# Patient Record
Sex: Male | Born: 1985 | Race: White | Hispanic: No | Marital: Married | Smoking: Never smoker
Health system: Southern US, Community
[De-identification: ages and names within clinical notes are randomized; demographics above are authoritative.]

---

## 2012-06-27 ENCOUNTER — Encounter (HOSPITAL_BASED_OUTPATIENT_CLINIC_OR_DEPARTMENT_OTHER): Payer: Self-pay | Admitting: *Deleted

## 2012-06-27 ENCOUNTER — Emergency Department (HOSPITAL_BASED_OUTPATIENT_CLINIC_OR_DEPARTMENT_OTHER): Payer: 59

## 2012-06-27 ENCOUNTER — Emergency Department (HOSPITAL_BASED_OUTPATIENT_CLINIC_OR_DEPARTMENT_OTHER)
Admission: EM | Admit: 2012-06-27 | Discharge: 2012-06-27 | Disposition: A | Discharge status: Home / Self Care | Payer: 59 | Attending: Emergency Medicine | Admitting: Emergency Medicine

## 2012-06-27 DIAGNOSIS — Y929 Unspecified place or not applicable: Secondary | ICD-10-CM | POA: Insufficient documentation

## 2012-06-27 DIAGNOSIS — Y939 Activity, unspecified: Secondary | ICD-10-CM | POA: Insufficient documentation

## 2012-06-27 DIAGNOSIS — S335XXA Sprain of ligaments of lumbar spine, initial encounter: Secondary | ICD-10-CM | POA: Insufficient documentation

## 2012-06-27 DIAGNOSIS — X500XXA Overexertion from strenuous movement or load, initial encounter: Secondary | ICD-10-CM | POA: Insufficient documentation

## 2012-06-27 DIAGNOSIS — S39012A Strain of muscle, fascia and tendon of lower back, initial encounter: Secondary | ICD-10-CM

## 2012-06-27 MED ORDER — IBUPROFEN 800 MG PO TABS
800.0000 mg | ORAL_TABLET | Freq: Once | ORAL | Status: AC
  Administered 2012-06-27: 800 mg via ORAL
  Filled 2012-06-27: qty 1

## 2012-06-27 MED ORDER — CYCLOBENZAPRINE HCL 10 MG PO TABS
5.0000 mg | ORAL_TABLET | Freq: Once | ORAL | Status: AC
  Administered 2012-06-27: 5 mg via ORAL
  Filled 2012-06-27: qty 1

## 2012-06-27 MED ORDER — OXYCODONE-ACETAMINOPHEN 5-325 MG PO TABS: 1.0000 | ORAL_TABLET | ORAL | Status: DC | PRN

## 2012-06-27 NOTE — ED Notes (Signed)
Pt states he injured his lower back while lifting weights about 15 min ago.

## 2012-06-27 NOTE — ED Provider Notes (Addendum)
History  This chart was scribed for Hilario Quarry, MD by Shari Heritage, ED Scribe. The patient was seen in room MHT13/MHT13. Patient's care was started at 1842.   CSN: [id number]  Arrival date & time 06/27/12  1831   First MD Initiated Contact with Patient 06/27/12 1842      Chief Complaint  Patient presents with  . Back Pain     Patient is a 27 y.o. male presenting with back pain. The history is provided by the patient. No language interpreter was used.  Back Pain Location:  Lumbar spine Quality:  Stiffness Stiffness is present:  Unable to specify Radiates to:  Does not radiate Pain severity:  Moderate Pain is:  Same all the time Onset quality:  Sudden Duration:  1 hour Timing:  Constant Associated symptoms: no numbness, no tingling and no weakness      HPI Comments: Marvin Baker is a 27 y.o. male who presents to the Emergency Department complaining of  constant, moderate, right lower back pain that began less than 1 hour ago. He describes pain as stiffness. Patient states that at onset pain was 10/10, but is now 7-8/10. He says that he was doing dead lifts when he heard something pop and started having pain. 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 He denies any tingling, numbness or weakness in his legs. Patient drove himself to the ED. He reports no pertinent past medical or surgical history.  History reviewed. No pertinent past medical history.  History reviewed. No pertinent past surgical history.  History reviewed. No pertinent family history.  History  Substance Use Topics  . Smoking status: Never Smoker   . Smokeless tobacco: Not on file  . Alcohol Use: No      Review of Systems  Musculoskeletal: Positive for back pain.  Neurological: Negative for tingling, weakness and numbness.    Allergies  Review of patient's allergies indicates no known allergies.  Home Medications  No current outpatient prescriptions on file.  Triage Vitals: BP 151/61  Pulse 56   Temp(Src) 97.5 F (36.4 C) (Oral)  Resp 18  Ht 6\' 2"  (1.88 m)  Wt 210 lb (95.255 kg)  BMI 26.95 kg/m2  SpO2 99%  Physical Exam  Nursing note and vitals reviewed. Constitutional: He appears well-developed and well-nourished. No distress.  Awake, alert, nontoxic appearance with baseline speech.  HENT:  Head: Normocephalic and atraumatic.  Eyes: Conjunctivae and EOM are normal. Pupils are equal, round, and reactive to light.  Neck: Normal range of motion. Neck supple.  Cardiovascular: Normal rate and regular rhythm.   Pulmonary/Chest: Effort normal and breath sounds normal.  Abdominal: Soft. There is no tenderness. There is no rebound.  Musculoskeletal:       Lumbar back: He exhibits tenderness.  Mild tenderness to left paralumbar area.  Neurological:  Mental status baseline for patient.  Upper extremity motor strength and sensation intact and symmetric bilaterally.  Skin: Skin is warm and dry. No rash noted.  Psychiatric: He has a normal mood and affect. His behavior is normal.    ED Course  Procedures (including critical care time) DIAGNOSTIC STUDIES: Oxygen Saturation is 99% on room air, normal by my interpretation.    COORDINATION OF CARE: 6:56 PM- Patient informed of current plan for treatment and evaluation and agrees with plan at this time.      Labs Reviewed - No data to display No results found.   No diagnosis found.    MDM  Dg Lumbar Spine  Complete  06/27/2012  *RADIOLOGY REPORT*  Clinical Data: Weight lifting injury.  LUMBAR SPINE - COMPLETE 4+ VIEW  Comparison: None.  Findings: Vertebral body height and alignment are normal. Intervertebral disc space height is maintained.  Schmorl's nodes in the lumbar spine are noted.  IMPRESSION: No acute finding.   Original Report Authenticated By: Holley Dexter, M.D.     I personally performed the services described in this documentation, which was scribed in my presence. The recorded information has been reviewed  and considered.   Hilario Quarry, MD 06/27/12 [id number]  Hilario Quarry, MD 06/27/12 [phone]

## 2014-04-05 IMAGING — CR DG LUMBAR SPINE COMPLETE 4+V
5 series · 5 of 5 positions shown · non-contrast
Comparison: None.

CLINICAL DATA: Weight lifting injury.

LUMBAR SPINE - COMPLETE 4+ VIEW

[t l-spine a.p.]
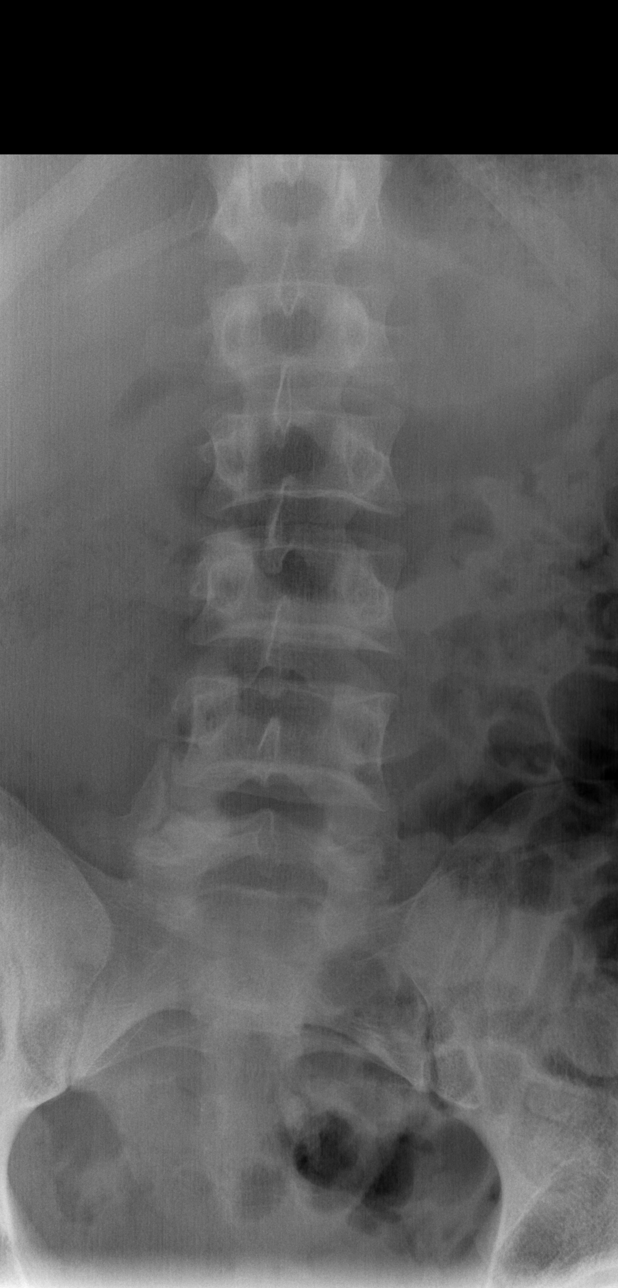

[t l-spine oblique exposure (1 of 2)]
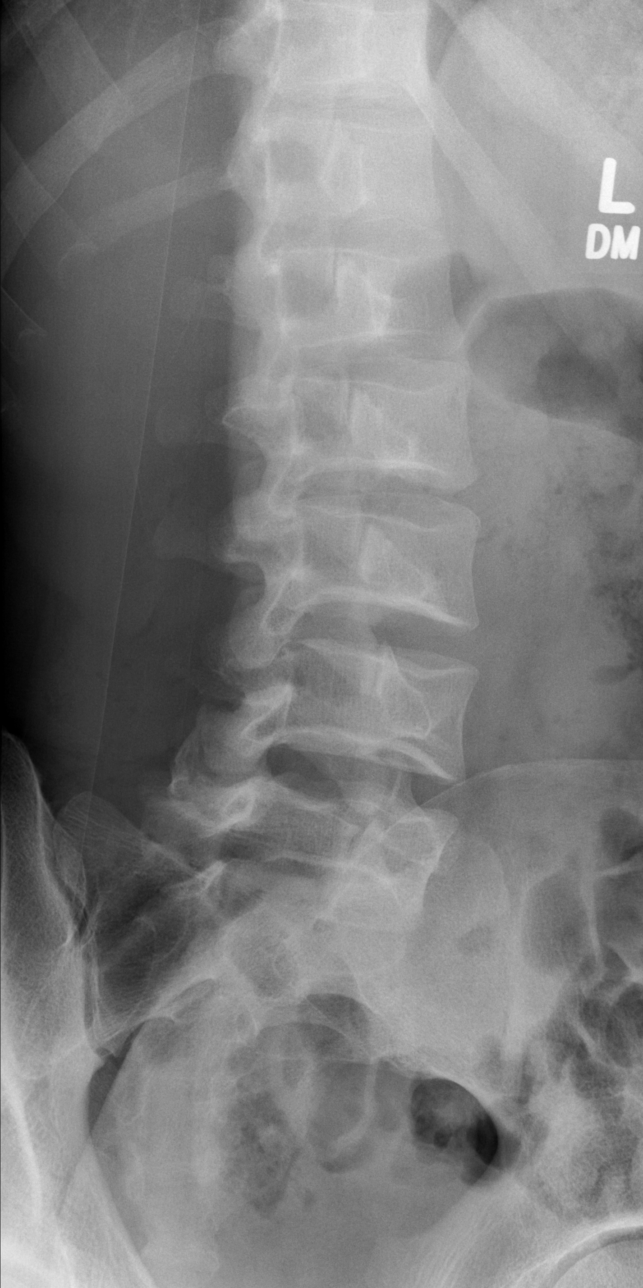

[t l-spine lat]
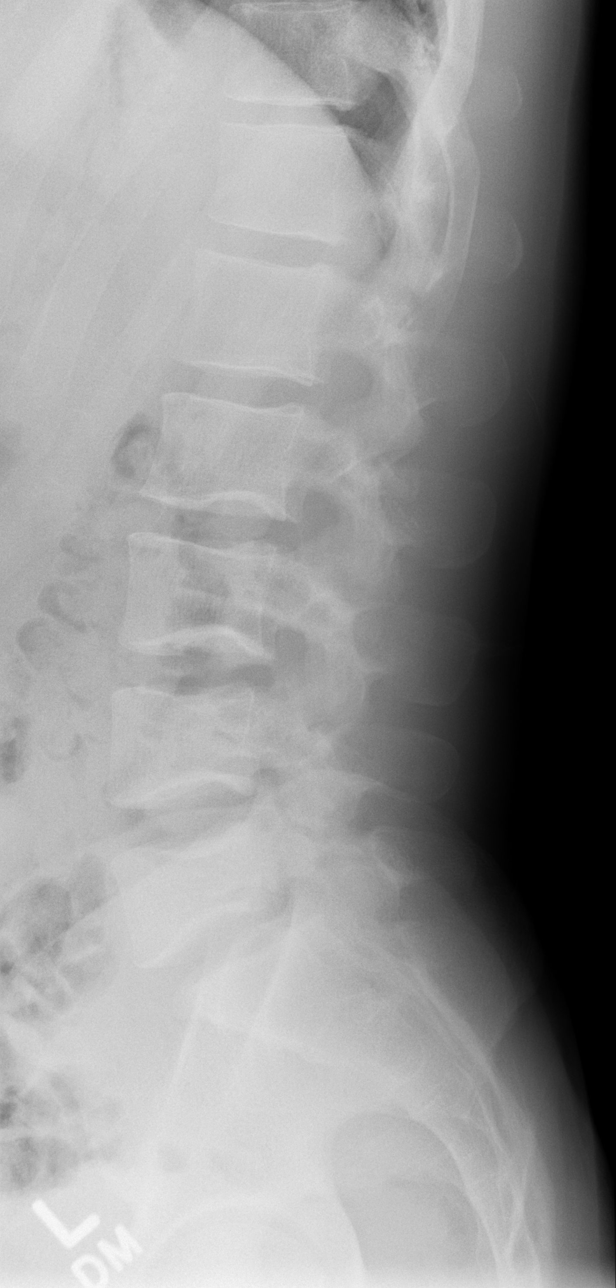

[t l-spine l5-s1 spot]
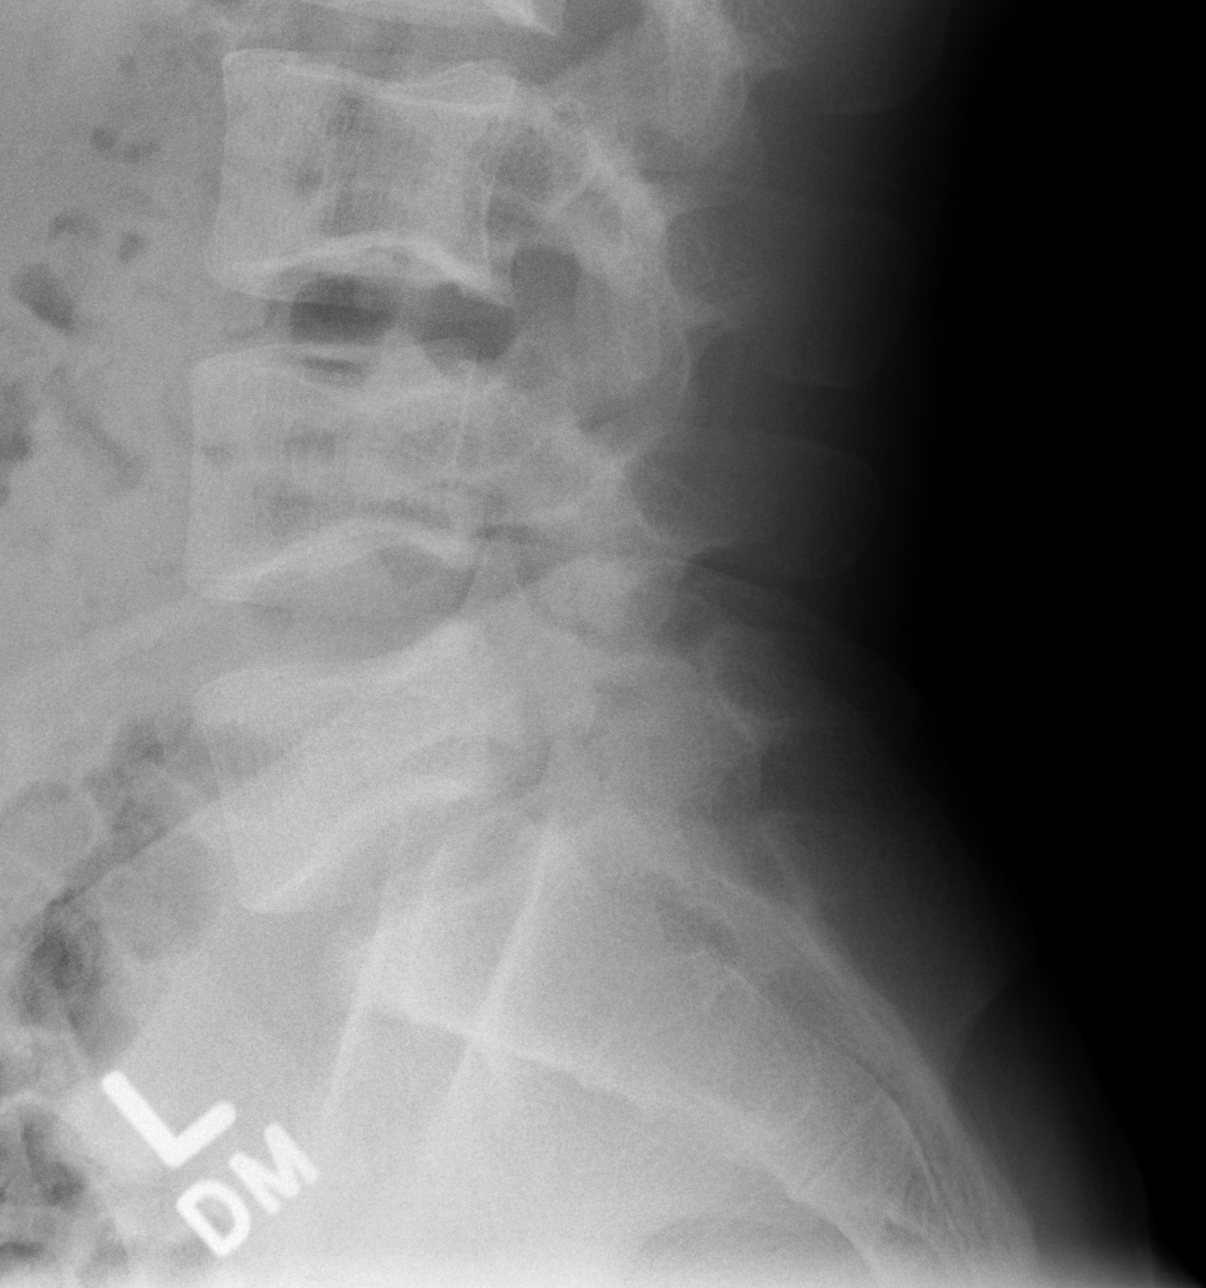

[t l-spine oblique exposure (2 of 2)]
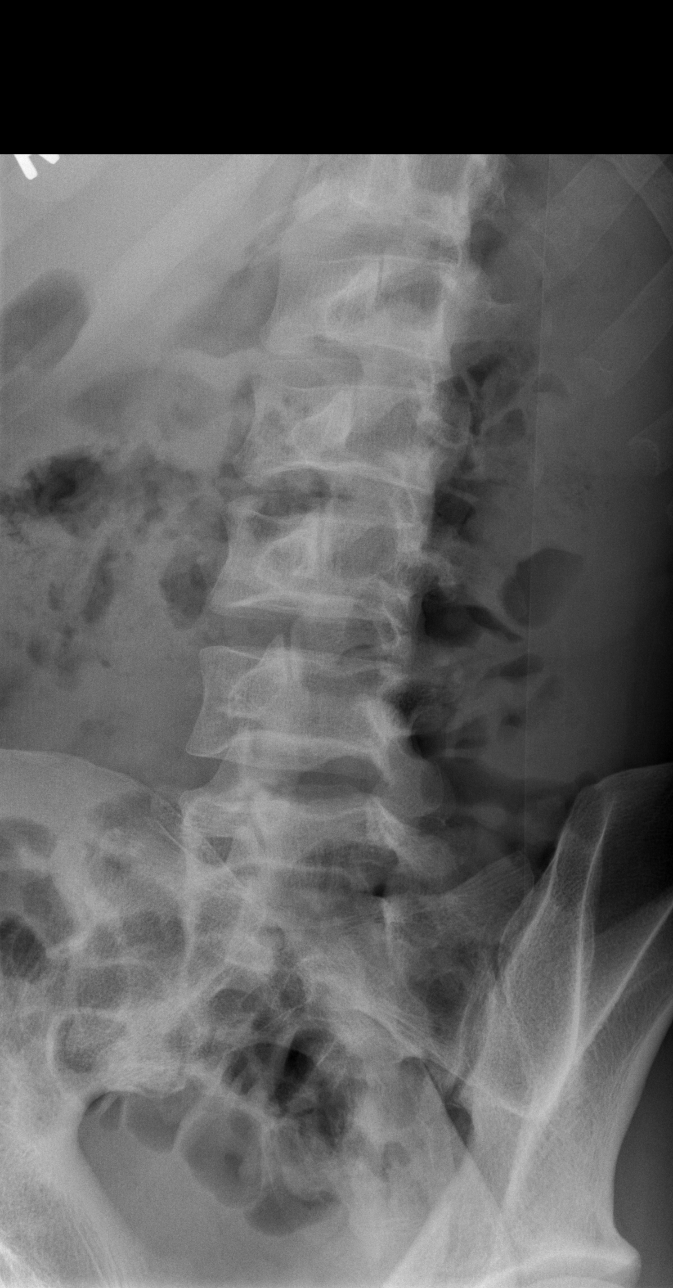

[5 of 5 positions shown; findings below may reference images not displayed]

FINDINGS: Vertebral body height and alignment are normal.
Intervertebral disc space height is maintained.  Schmorl's nodes in
the lumbar spine are noted.
IMPRESSION: No acute finding.

## 2015-08-22 ENCOUNTER — Emergency Department (HOSPITAL_COMMUNITY): Payer: Self-pay

## 2015-08-22 ENCOUNTER — Emergency Department (HOSPITAL_COMMUNITY)
Admission: EM | Admit: 2015-08-22 | Discharge: 2015-08-22 | Disposition: A | Discharge status: Home / Self Care | Payer: Self-pay | Attending: Emergency Medicine | Admitting: Emergency Medicine

## 2015-08-22 ENCOUNTER — Encounter (HOSPITAL_COMMUNITY): Payer: Self-pay | Admitting: *Deleted

## 2015-08-22 DIAGNOSIS — S299XXA Unspecified injury of thorax, initial encounter: Secondary | ICD-10-CM

## 2015-08-22 DIAGNOSIS — W208XXA Other cause of strike by thrown, projected or falling object, initial encounter: Secondary | ICD-10-CM | POA: Insufficient documentation

## 2015-08-22 DIAGNOSIS — S29001A Unspecified injury of muscle and tendon of front wall of thorax, initial encounter: Secondary | ICD-10-CM | POA: Insufficient documentation

## 2015-08-22 DIAGNOSIS — Y93B3 Activity, free weights: Secondary | ICD-10-CM | POA: Insufficient documentation

## 2015-08-22 DIAGNOSIS — Y9289 Other specified places as the place of occurrence of the external cause: Secondary | ICD-10-CM | POA: Insufficient documentation

## 2015-08-22 DIAGNOSIS — Y998 Other external cause status: Secondary | ICD-10-CM | POA: Insufficient documentation

## 2015-08-22 MED ORDER — IBUPROFEN 800 MG PO TABS
800.0000 mg | ORAL_TABLET | Freq: Once | ORAL | Status: AC
  Administered 2015-08-22: 800 mg via ORAL
  Filled 2015-08-22: qty 1

## 2015-08-22 MED ORDER — ACETAMINOPHEN 325 MG PO TABS
650.0000 mg | ORAL_TABLET | Freq: Once | ORAL | Status: AC
  Administered 2015-08-22: 650 mg via ORAL
  Filled 2015-08-22: qty 2

## 2015-08-22 MED ORDER — IBUPROFEN 800 MG PO TABS: 800.0000 mg | ORAL_TABLET | Freq: Three times a day (TID) | ORAL | Status: AC

## 2015-08-22 NOTE — Discharge Instructions (Signed)
Your xray today shows no abnormalities.  EKG is normal.  Please take Ibuprofen 3 times daily for pain.  [facility]  Return to the ED if you experience worsening chest pain, SOB, or difficulty breathing.  Blunt Chest Trauma Blunt chest trauma is an injury caused by a blow to the chest. These chest injuries can be very painful. Blunt chest trauma often results in bruised or broken (fractured) ribs. Most cases of bruised and fractured ribs from blunt chest traumas get better after 1 to 3 weeks of rest and pain medicine. Often, the soft tissue in the chest wall is also injured, causing pain and bruising. Internal organs, such as the heart and lungs, may also be injured. Blunt chest trauma can lead to serious medical problems. This injury requires immediate medical care. CAUSES   Motor vehicle collisions.  Falls.  Physical violence.  Sports injuries. SYMPTOMS   Chest pain. The pain may be worse when you move or breathe deeply.  Shortness of breath.  Lightheadedness.  Bruising.  Tenderness.  Swelling. DIAGNOSIS  Your caregiver will do a physical exam. X-rays may be taken to look for fractures. However, minor rib fractures may not show up on X-rays until a few days after the injury. If a more serious injury is suspected, further imaging tests may be done. This may include ultrasounds, computed tomography (CT) scans, or magnetic resonance imaging (MRI). TREATMENT  Treatment depends on the severity of your injury. Your caregiver may prescribe pain medicines and deep breathing exercises. HOME CARE INSTRUCTIONS  Limit your activities until you can move around without much pain.  Do not do any strenuous work until your injury is healed.  Put ice on the injured area.  Put ice in a plastic bag.  Place a towel between your skin and the bag.  Leave the ice on for 15-20 minutes, 03-04 times a day.  You may wear a rib belt as directed  by your caregiver to reduce pain.  Practice deep breathing as directed by your caregiver to keep your lungs clear.  Only take over-the-counter or prescription medicines for pain, fever, or discomfort as directed by your caregiver. SEEK IMMEDIATE MEDICAL CARE IF:   You have increasing pain or shortness of breath.  You cough up blood.  You have nausea, vomiting, or abdominal pain.  You have a fever.  You feel dizzy, weak, or you faint. MAKE SURE YOU:  Understand these instructions.  Will watch your condition.  Will get help right away if you are not doing well or get worse.   This information is not intended to replace advice given to you by your health care provider. Make sure you discuss any questions you have with your health care provider.   Document Released: 05/15/2004 Document Revised: 04/28/2014 Document Reviewed: 10/04/2014 Elsevier Interactive Patient Education [employer]

## 2015-08-22 NOTE — ED Provider Notes (Signed)
CSN: [id number]     Arrival date & time 08/22/15  1716 History   First MD Initiated Contact with Patient 08/22/15 1750     Chief Complaint  Patient presents with  . Chest Injury     (Consider location/radiation/quality/duration/timing/severity/associated sxs/prior Treatment) HPI Marvin Baker is a 30 y.o. male with no significant PMH who presents sudden onset, constant, improving chest soreness approximately 5 PM (1 hour ago).  Patient reports he was bench pressing 405 lbs when his wrist slipped causing the barbell to fall onto his chest.  He states the barbell was approximally 6-12 inches off of his chest.  Aggravating factors include deep inspiration and movement.  Pain 6/10.  Denies SOB, cough, abdominal pain, head injury, LOC, N/V/D, or urinary symptoms.   History reviewed. No pertinent past medical history. History reviewed. No pertinent past surgical history. No family history on file. Social History  Substance Use Topics  . Smoking status: Never Smoker   . Smokeless tobacco: None  . Alcohol Use: No    Review of Systems All other systems negative unless otherwise stated in HPI    Allergies  Review of patient's allergies indicates no known allergies.  Home Medications   Prior to Admission medications   Medication Sig Start Date End Date Taking? Authorizing Provider  cetirizine (ZYRTEC) 10 MG tablet Take 10 mg by mouth daily as needed for allergies.   Yes Historical Provider, MD  Phenylephrine HCl (AFRIN ALLERGY NA) Place 1 spray into the nose daily as needed (congestion).   Yes Historical Provider, MD   BP 143/75 mmHg  Pulse 80  Temp(Src) 98.2 F (36.8 C) (Oral)  Resp 14  Ht  (1.905 m)  Wt 117.935 kg  BMI 32.50 kg/m2  SpO2 100% Physical Exam  Constitutional: He is oriented to person, place, and time. He appears well-developed and well-nourished.  Non-toxic appearance. He does not have a sickly appearance. He does not appear ill.  HENT:  Head: Normocephalic  and atraumatic.  Mouth/Throat: Oropharynx is clear and moist.  Eyes: Conjunctivae are normal. Pupils are equal, round, and reactive to light.  Neck: Normal range of motion. Neck supple.  Cardiovascular: Normal rate, regular rhythm and normal heart sounds.   No murmur heard. Pulmonary/Chest: Effort normal and breath sounds normal. No accessory muscle usage or stridor. No respiratory distress. He has no wheezes. He has no rhonchi. He has no rales. He exhibits tenderness (mild).    Abdominal: Soft. Bowel sounds are normal. He exhibits no distension. There is no tenderness.  Musculoskeletal: Normal range of motion.  Lymphadenopathy:    He has no cervical adenopathy.  Neurological: He is alert and oriented to person, place, and time.  Speech clear without dysarthria.  Skin: Skin is warm and dry.  Psychiatric: He has a normal mood and affect. His behavior is normal.    ED Course  Procedures (including critical care time) Labs Review Labs Reviewed - No data to display  Imaging Review Dg Chest 2 View  08/22/2015  CLINICAL DATA:  Trauma to chest by 400 pound barbell. EXAM: CHEST  2 VIEW COMPARISON:  None. FINDINGS: Heart size is normal. Overall cardiomediastinal silhouette is normal in size and configuration. Lungs are clear. No pleural effusion or pneumothorax seen. Osseous and soft tissue structures about the chest are unremarkable. Specifically, no sternal or rib fracture/displacement identified. IMPRESSION: Negative. Electronically Signed   By: Bary Richard M.D.   On: 08/22/2015 18:15   I have personally reviewed and evaluated these images  and lab results as part of my medical decision-making.   EKG Interpretation   Date/Time:  Wednesday Aug 22 2015 18:20:43 EDT Ventricular Rate:  77 PR Interval:  128 QRS Duration: 88 QT Interval:  371 QTC Calculation: 420 R Axis:   75 Text Interpretation:  Sinus rhythm Confirmed by HAVILAND MD, JULIE ([id number])  on 08/22/2015 6:24:49 PM      MDM     Final diagnoses:  Chest injury, initial encounter   Patient presents for evaluation of chest injury. VSS, NAD. On exam, heart RRR, lungs CTAP, abdomen soft and benign. Chest wall mildly tender to wear barbell fell. No crepitus. Chest x-ray negative. EKG normal without acute changes. Patient received ibuprofen and Tylenol in the ED. Discharged home with ibuprofen. Discussed return precautions. Follow-up with [facility] wellness clinic as needed. Patient agrees and acknowledges the above plan for discharge.  Case has been discussed with Dr. Particia NearingHaviland who agrees with the above plan for discharge.      Cheri FowlerKayla Ra Pfiester, PA-C 08/22/15 1836

## 2015-08-22 NOTE — ED Notes (Signed)
Pt states a 405lb barbell fell onto his chest while he was benchpressing today. Pt states the barbell slipped off his wrist and onto his chest. Pt states the pain is worse while taking a deep breath. Pain is 6/10.

## 2019-04-15 ENCOUNTER — Other Ambulatory Visit: Payer: Self-pay

## 2019-04-15 ENCOUNTER — Emergency Department (HOSPITAL_COMMUNITY)
Admission: EM | Admit: 2019-04-15 | Discharge: 2019-04-15 | Disposition: A | Discharge status: Home / Self Care | Payer: Managed Care, Other (non HMO) | Attending: Emergency Medicine | Admitting: Emergency Medicine

## 2019-04-15 ENCOUNTER — Emergency Department (HOSPITAL_COMMUNITY): Payer: Managed Care, Other (non HMO)

## 2019-04-15 DIAGNOSIS — Y29XXXA Contact with blunt object, undetermined intent, initial encounter: Secondary | ICD-10-CM | POA: Insufficient documentation

## 2019-04-15 DIAGNOSIS — Y9289 Other specified places as the place of occurrence of the external cause: Secondary | ICD-10-CM | POA: Insufficient documentation

## 2019-04-15 DIAGNOSIS — Y999 Unspecified external cause status: Secondary | ICD-10-CM | POA: Insufficient documentation

## 2019-04-15 DIAGNOSIS — S0993XA Unspecified injury of face, initial encounter: Secondary | ICD-10-CM | POA: Diagnosis present

## 2019-04-15 DIAGNOSIS — J3489 Other specified disorders of nose and nasal sinuses: Secondary | ICD-10-CM

## 2019-04-15 DIAGNOSIS — S022XXA Fracture of nasal bones, initial encounter for closed fracture: Secondary | ICD-10-CM

## 2019-04-15 DIAGNOSIS — Y939 Activity, unspecified: Secondary | ICD-10-CM | POA: Diagnosis not present

## 2019-04-15 MED ORDER — NAPROXEN 500 MG PO TABS: 500.0000 mg | ORAL_TABLET | Freq: Two times a day (BID) | ORAL | 0 refills | Status: AC

## 2019-04-15 NOTE — Discharge Instructions (Signed)
Please take the naproxen, as prescribed.  Avoid blowing your nose.  Please follow-up with the ENT in 6 to 10 days for ongoing evaluation and management of your nondisplaced nasal fracture.  Return to the ED or seek medical attention immediately for any inability to breathe in and out of your nose or any uncontrolled epistaxis.  Return for any other new or worsening symptoms.

## 2019-04-15 NOTE — ED Triage Notes (Signed)
Pt arrives pov after getting hit by a door in the face. States he thinks he broke his nose. Pt in NAD.

## 2019-04-15 NOTE — ED Provider Notes (Signed)
[facility] EMERGENCY DEPARTMENT Provider Note   CSN: [id number] Arrival date & time: 04/15/19  1053     History Chief Complaint  Patient presents with  . Facial Pain    Marvin Baker is a 33 y.o. male with no significant PMH presents to the ED after sustaining a facial injury.  He reports that he was visiting family at [employer] when he turned into a door that he thought his wife was holding open for him.  Hemostasis achieved.  No significant discomfort, or obvious deformity.  No other complaints at this time he was feeling well prior to accident.  Denies any difficulty breathing, trismus, ability to swallow, neck discomfort, headache, blurred vision, or other symptoms.  HPI     No past medical history on file.  There are no problems to display for this patient.   No past surgical history on file.     No family history on file.  Social History   Tobacco Use  . Smoking status: Never Smoker  Substance Use Topics  . Alcohol use: No  . Drug use: No    Home Medications Prior to Admission medications   Medication Sig Start Date End Date Taking? Authorizing Provider  cetirizine (ZYRTEC) 10 MG tablet Take 10 mg by mouth daily as needed for allergies.    [provider]  ibuprofen (ADVIL,MOTRIN) 800 MG tablet Take 1 tablet (800 mg total) by mouth 3 (three) times daily. 08/22/15   Gloriann Loan, PA-C  naproxen (NAPROSYN) 500 MG tablet Take 1 tablet (500 mg total) by mouth 2 (two) times daily. 04/15/19   Corena Herter, PA-C  Phenylephrine HCl (AFRIN ALLERGY NA) Place 1 spray into the nose daily as needed (congestion).    [provider]    Allergies    Patient has no known allergies.  Review of Systems   Review of Systems  Constitutional: Negative for chills and fever.  HENT: Positive for nosebleeds. Negative for drooling, facial swelling and trouble swallowing.   Eyes: Negative for visual disturbance.  Neurological: Negative  for dizziness and headaches.    Physical Exam Updated Vital Signs BP (!) 144/84 (BP Location: Right Arm)   Pulse 83   Temp 98 F (36.7 C) (Oral)   Resp 16   SpO2 96%   Physical Exam Vitals and nursing note reviewed. Exam conducted with a chaperone present.  Constitutional:      Appearance: Normal appearance.  HENT:     Head: Normocephalic and atraumatic.     Nose:     Comments: Obvious deformity.  Possible deviated septum, but no obvious septal hematomas.  Hemostasis achieved.  Nares patent bilaterally.  Able to breathe through each nare.    Mouth/Throat:     Comments: No deformities, bleeding, or broken teeth. Eyes:     General: No scleral icterus.    Extraocular Movements: Extraocular movements intact.     Conjunctiva/sclera: Conjunctivae normal.     Pupils: Pupils are equal, round, and reactive to light.     Comments: No visual impairment.  Cardiovascular:     Rate and Rhythm: Normal rate and regular rhythm.     Pulses: Normal pulses.     Heart sounds: Normal heart sounds.  Pulmonary:     Effort: Pulmonary effort is normal. No respiratory distress.     Breath sounds: Normal breath sounds.  Musculoskeletal:     Cervical back: Normal range of motion and neck supple. No rigidity or tenderness.  Skin:  General: Skin is dry.  Neurological:     Mental Status: He is alert.     GCS: GCS eye subscore is 4. GCS verbal subscore is 5. GCS motor subscore is 6.  Psychiatric:        Mood and Affect: Mood normal.        Behavior: Behavior normal.        Thought Content: Thought content normal.       ED Results / Procedures / Treatments   Labs (all labs ordered are listed, but only abnormal results are displayed) Labs Reviewed - No data to display  EKG None  Radiology DG Nasal Bones  Result Date: 04/15/2019 CLINICAL DATA:  33 year old who was struck in the nose by a door earlier today. Pain and bleeding. Initial encounter. EXAM: NASAL BONES - 3+ VIEW COMPARISON:   None. FINDINGS: There is evidence of a nondisplaced fracture involving the tip of the nasal bone. There may be a nondisplaced fracture involving the mid portion of the nasal bones. The bony nasal septum appears midline. IMPRESSION: 1. Nondisplaced fracture involving the tip of the nasal bone. 2. Possible nondisplaced fracture involving the mid portion of the nasal bones. Electronically Signed   By: Hulan Saas M.D.   On: 04/15/2019 12:46    Procedures Procedures (including critical care time)  Medications Ordered in ED Medications - No data to display  ED Course  I have reviewed the triage vital signs and the nursing notes.  Pertinent labs & imaging results that were available during my care of the patient were reviewed by me and considered in my medical decision making (see chart for details).    MDM Rules/Calculators/A&P                      DG facial bones are reviewed and demonstrate a nondisplaced fracture involving the tip of the nasal bone as well as a possibly nondisplaced fracture involving the midportion of the nasal bones.  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  Given the nondisplaced quality of fractures and chronic deformity, we will not attempt to reduce.  Patient is able to present for both nares and there is no evidence of a septal hematoma this time.  Patient has no acute distress and had no significant pain or discomfort with me touching the tip of his nose to look into his nares bilaterally.  Will refer patient to ENT for follow-up in 6 days and encouraged him to avoid blowing his nose.  Strict return precautions including inability to breathe in and out to his nose or uncontrolled epistaxis.  Will prescribe naproxen for his pain and swelling.   Final Clinical Impression(s) / ED Diagnoses Final diagnoses:  Closed fracture of nasal bone, initial encounter    Rx / DC Orders  ED Discharge Orders         Ordered    naproxen (NAPROSYN) 500 MG tablet  2 times daily     04/15/19 1307           Elvera Maria 04/15/19 1310    Loren Racer, MD 04/15/19 1357

## 2019-04-15 NOTE — ED Notes (Signed)
Patient verbalizes understanding of discharge instructions. Opportunity for questioning and answers were provided. Armband removed by staff, pt discharged from ED.  

## 2021-01-21 IMAGING — CR DG NASAL BONES 3+V
3 series · 3 of 3 positions shown · non-contrast
Comparison: None.

CLINICAL DATA: 33-year-old who was struck in the nose by a door
earlier today. Pain and bleeding. Initial encounter.

EXAM:
NASAL BONES - 3+ VIEW

[nasal waters]
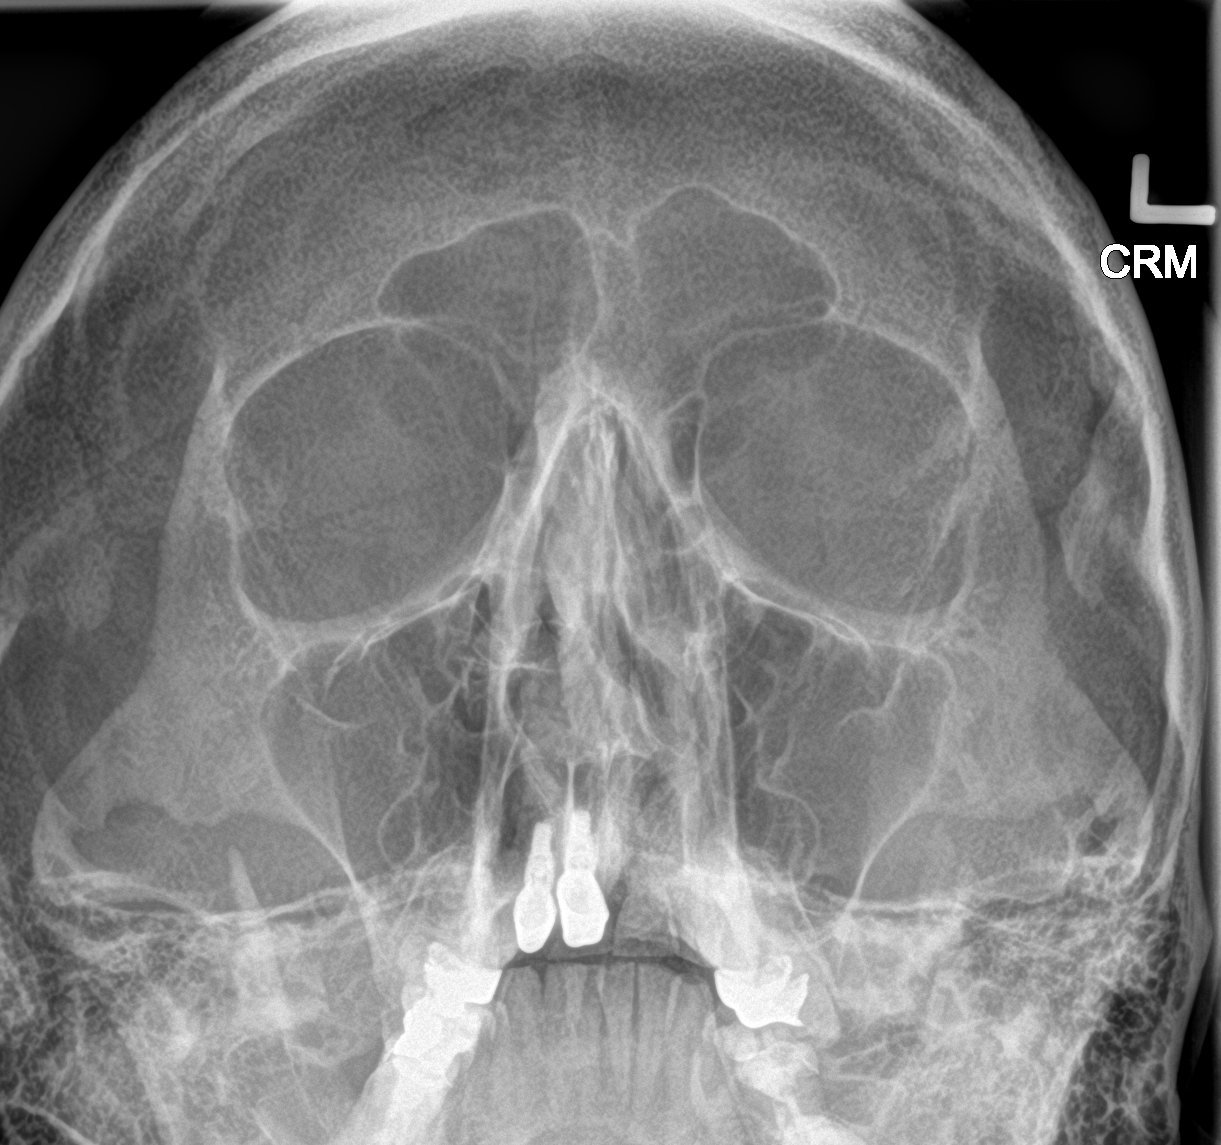

[nasal lat (1 of 2)]
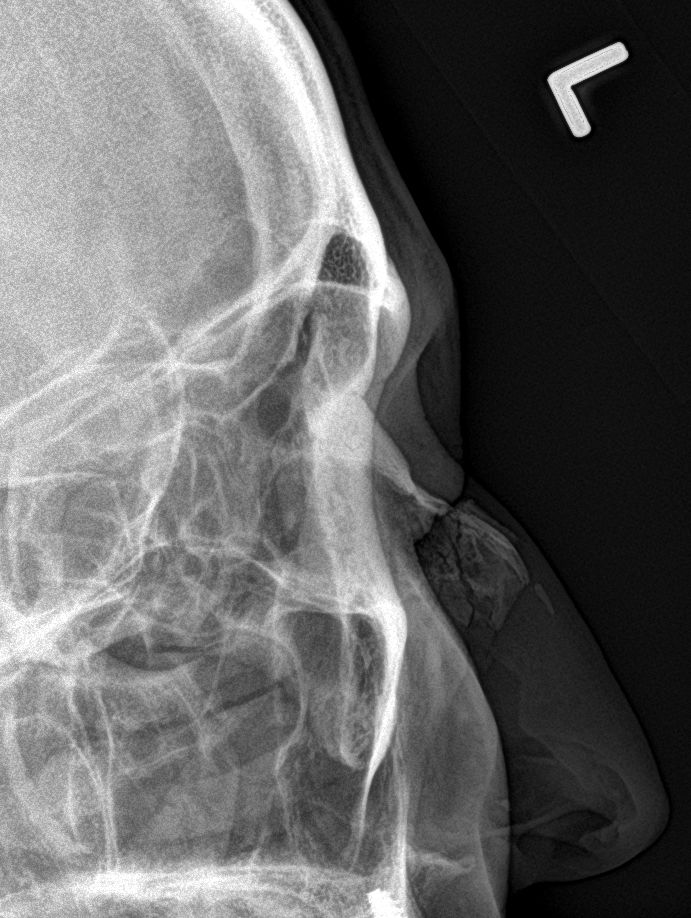

[nasal lat (2 of 2)]
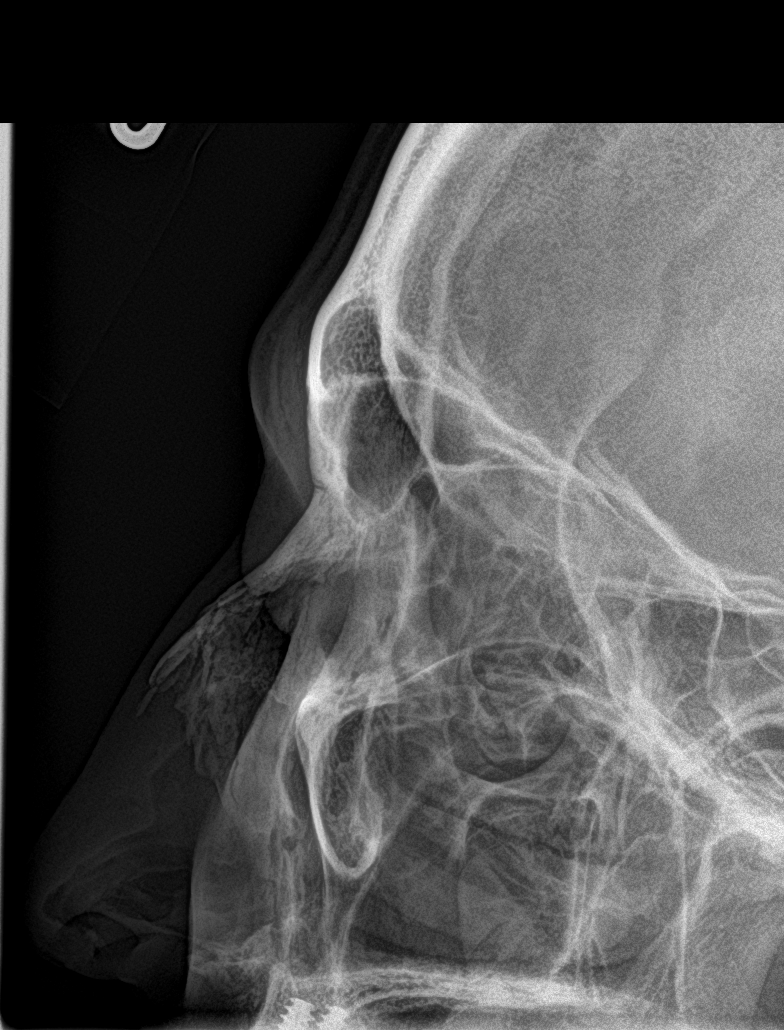

[3 of 3 positions shown; findings below may reference images not displayed]

FINDINGS: There is evidence of a nondisplaced fracture involving the tip of
the nasal bone. There may be a nondisplaced fracture involving the
mid portion of the nasal bones.

The bony nasal septum appears midline.
IMPRESSION: 1. Nondisplaced fracture involving the tip of the nasal bone.
2. Possible nondisplaced fracture involving the mid portion of the
nasal bones.

## 2021-06-28 ENCOUNTER — Emergency Department (HOSPITAL_BASED_OUTPATIENT_CLINIC_OR_DEPARTMENT_OTHER)
Admission: EM | Admit: 2021-06-28 | Discharge: 2021-06-28 | Disposition: A | Discharge status: Home / Self Care | Payer: 59 | Attending: Emergency Medicine | Admitting: Emergency Medicine

## 2021-06-28 ENCOUNTER — Other Ambulatory Visit: Payer: Self-pay

## 2021-06-28 ENCOUNTER — Other Ambulatory Visit (HOSPITAL_BASED_OUTPATIENT_CLINIC_OR_DEPARTMENT_OTHER): Payer: Self-pay

## 2021-06-28 ENCOUNTER — Encounter (HOSPITAL_BASED_OUTPATIENT_CLINIC_OR_DEPARTMENT_OTHER): Payer: Self-pay

## 2021-06-28 ENCOUNTER — Emergency Department (HOSPITAL_BASED_OUTPATIENT_CLINIC_OR_DEPARTMENT_OTHER): Payer: 59

## 2021-06-28 DIAGNOSIS — R053 Chronic cough: Secondary | ICD-10-CM | POA: Diagnosis not present

## 2021-06-28 DIAGNOSIS — R059 Cough, unspecified: Secondary | ICD-10-CM | POA: Diagnosis present

## 2021-06-28 MED ORDER — AZITHROMYCIN 250 MG PO TABS
250.0000 mg | ORAL_TABLET | Freq: Every day | ORAL | 0 refills | Status: AC
  Filled 2021-06-28: qty 6, 5d supply, fill #0

## 2021-06-28 MED ORDER — ALBUTEROL SULFATE HFA 108 (90 BASE) MCG/ACT IN AERS
1.0000 | INHALATION_SPRAY | Freq: Four times a day (QID) | RESPIRATORY_TRACT | 0 refills | Status: AC | PRN
  Filled 2021-06-28: qty 8.5, 25d supply, fill #0

## 2021-06-28 MED ORDER — BENZONATATE 100 MG PO CAPS
100.0000 mg | ORAL_CAPSULE | Freq: Three times a day (TID) | ORAL | 0 refills | Status: AC
  Filled 2021-06-28: qty 21, 7d supply, fill #0

## 2021-06-28 MED ORDER — CETIRIZINE HCL 10 MG PO TABS
10.0000 mg | ORAL_TABLET | Freq: Every day | ORAL | 0 refills | Status: AC | PRN
  Filled 2021-06-28: qty 100, 100d supply, fill #0

## 2021-06-28 NOTE — ED Provider Notes (Signed)
[facility] EMERGENCY DEPARTMENT Provider Note   CSN: [id number] Arrival date & time: 06/28/21  1525    History  Chief Complaint  Patient presents with   Cough    Marvin Baker is a 36 y.o. male with a past medical history here for evaluation of cough over the last 2 months.  Cough intermittently productive of green sputum.  Worse in the morning.  He is not a tobacco user.  He denies any fever, congestion, rhinorrhea, chest pain, shortness of breath, back pain, abdominal pain, lower extremity swelling.  No history of PE or DVT.  No meds PTA.  No history of allergies.  No recent surgery, immobilization or malignancy.  Seen at urgent care at onset with negative COVID test.  He is a weightlifter at baseline.  He denies any swelling to his extremities.  Denies exogenous hormone use. Intermittently uses steroids when prepping for weight lifting competitions.  No lower extremity swelling, edema, erythema, warmth.  No PND or orthopnea.  No chronic medical problems.  HPI     Home Medications Prior to Admission medications   Medication Sig Start Date End Date Taking? Authorizing Provider  albuterol (VENTOLIN HFA) 108 (90 Base) MCG/ACT inhaler Inhale 1-2 puffs into the lungs every 6 (six) hours as needed for wheezing or shortness of breath. 06/28/21  Yes Million Maharaj A, PA-C  azithromycin (ZITHROMAX) 250 MG tablet Take 1 tablet (250 mg total) by mouth daily. Take first 2 tablets together, then 1 every day until finished. 06/28/21  Yes Oree Hislop A, PA-C  benzonatate (TESSALON) 100 MG capsule Take 1 capsule (100 mg total) by mouth every 8 (eight) hours. 06/28/21  Yes Sukhraj Esquivias A, PA-C  cetirizine (ZYRTEC) 10 MG tablet Take 1 tablet (10 mg total) by mouth daily as needed for allergies. 06/28/21   Sherri Mcarthy A, PA-C  ibuprofen (ADVIL,MOTRIN) 800 MG tablet Take 1 tablet (800 mg total) by mouth 3 (three) times daily. 08/22/15   Gloriann Loan, PA-C  naproxen (NAPROSYN) 500 MG  tablet Take 1 tablet (500 mg total) by mouth 2 (two) times daily. 04/15/19   Corena Herter, PA-C  Phenylephrine HCl (AFRIN ALLERGY NA) Place 1 spray into the nose daily as needed (congestion).    [provider]      Allergies    Patient has no known allergies.    Review of Systems   Review of Systems  Constitutional: Negative.   HENT: Negative.    Respiratory:  Positive for cough. Negative for apnea, choking, chest tightness, shortness of breath, wheezing and stridor.   Cardiovascular: Negative.   Gastrointestinal: Negative.   Genitourinary: Negative.   Musculoskeletal: Negative.   Skin: Negative.   Neurological: Negative.   All other systems reviewed and are negative.  Physical Exam Updated Vital Signs BP (!) 154/90 (BP Location: Left Arm)    Pulse (!) 103    Temp 99.2 F (37.3 C) (Oral)    Resp 20    Ht 6\' 3"  (1.905 m)    Wt 117 kg    SpO2 99%    BMI 32.25 kg/m  Physical Exam Vitals and nursing note reviewed.  Constitutional:      General: He is not in acute distress.    Appearance: He is well-developed. He is not ill-appearing, toxic-appearing or diaphoretic.  HENT:     Head: Atraumatic.     Nose: Nose normal.     Mouth/Throat:     Mouth: Mucous membranes are moist.  Eyes:  Pupils: Pupils are equal, round, and reactive to light.  Cardiovascular:     Rate and Rhythm: Normal rate and regular rhythm.     Pulses: Normal pulses.          Radial pulses are 2+ on the right side and 2+ on the left side.     Heart sounds: Normal heart sounds.  Pulmonary:     Effort: Pulmonary effort is normal. No respiratory distress.     Comments: Coarse lung sounds lower bases clears with cough Abdominal:     General: Bowel sounds are normal. There is no distension.     Palpations: Abdomen is soft.     Tenderness: There is no abdominal tenderness.  Musculoskeletal:        General: No swelling, tenderness, deformity or signs of injury. Normal range of motion.     Cervical  back: Normal range of motion and neck supple.     Right lower leg: No edema.     Left lower leg: No edema.     Comments: Full range of motion, no bony tenderness, compartments soft, no lower extremity edema  Skin:    General: Skin is warm and dry.     Capillary Refill: Capillary refill takes less than 2 seconds.  Neurological:     General: No focal deficit present.     Mental Status: He is alert and oriented to person, place, and time.     Sensory: No sensory deficit.     Motor: No weakness.     Gait: Gait normal.   ED Results / Procedures / Treatments   Labs (all labs ordered are listed, but only abnormal results are displayed) Labs Reviewed - No data to display  EKG None  Radiology DG Chest 2 View  Result Date: 06/28/2021 CLINICAL DATA:  Cough for the past 2 months. EXAM: CHEST - 2 VIEW COMPARISON:  Chest x-ray dated Aug 22, 2015. FINDINGS: The heart size and mediastinal contours are within normal limits. Both lungs are clear. The visualized skeletal structures are unremarkable. IMPRESSION: No active cardiopulmonary disease. Electronically Signed   By: Titus Dubin M.D.   On: 06/28/2021 15:55    Procedures Procedures    Medications Ordered in ED Medications - No data to display  ED Course/ Medical Decision Making/ A&P    36 year old here for evaluation of cough over the last 2 months.  He is afebrile, nonseptic, not ill-appearing.  No history of similar.  No congestion, rhinorrhea, itchy, watery eyes.  No history of seasonal allergies.  No lower extremity edema, erythema, warmth.  He denies any chest pain, shortness of breath, PND, orthopnea, history of PE, DVT, recent surgery, mobilization or malignancy.  Not appear clinically fluid overloaded.  Does have cough which is worse in the morning, denies any tobacco use.  No meds PTA.  Did have some coarse lung sounds at bases which cleared with cough.  Imaging personally viewed and interpreted Chest x-ray that significant  abnormality  Question seasonal allergies versus early pneumonia versus bronchitis, low suspicion for acute PE, ACS, pneumothorax, edema, dissection.  We will try a short course of azithromycin, Tessalon Perles, butyryl, Zyrtec.  We will have him follow-up outpatient with PCP for further evaluation.  He is agreeable.  Wells criteria low risk.  The patient has been appropriately medically screened and/or stabilized in the ED. I have low suspicion for any other emergent medical condition which would require further screening, evaluation or treatment in the ED or require inpatient management.  Patient is hemodynamically stable and in no acute distress.  Patient able to ambulate in department prior to ED.  Evaluation does not show acute pathology that would require ongoing or additional emergent interventions while in the emergency department or further inpatient treatment.  I have discussed the diagnosis with the patient and answered all questions.  Pain is been managed while in the emergency department and patient has no further complaints prior to discharge.  Patient is comfortable with plan discussed in room and is stable for discharge at this time.  I have discussed strict return precautions for returning to the emergency department.  Patient was encouraged to follow-up with PCP/specialist refer to at discharge.                           Medical Decision Making Amount and/or Complexity of Data Reviewed Radiology: ordered and independent interpretation performed. Decision-making details documented in ED Course.  Risk OTC drugs. Prescription drug management.          Final Clinical Impression(s) / ED Diagnoses Final diagnoses:  Chronic cough    Rx / DC Orders ED Discharge Orders          Ordered    azithromycin (ZITHROMAX) 250 MG tablet  Daily        06/28/21 1636    benzonatate (TESSALON) 100 MG capsule  Every 8 hours        06/28/21 1636    albuterol (VENTOLIN HFA) 108 (90 Base)  MCG/ACT inhaler  Every 6 hours PRN        06/28/21 1636    cetirizine (ZYRTEC) 10 MG tablet  Daily PRN        06/28/21 1636              Candiss Galeana A, PA-C 06/28/21 1648    Charlesetta Shanks, MD 06/28/21 2150

## 2021-06-28 NOTE — Discharge Instructions (Addendum)
Start the medications that I have prescribed.  If you develop any chest pain, shortness of breath please seek reevaluation, otherwise follow-up with primary care provider ?

## 2021-06-28 NOTE — ED Triage Notes (Signed)
Pt c/o cough x 2 months-states he was seen at [facility] with neg covid test-pt requesting CXR-NAD-steady gait ?

## 2021-07-02 NOTE — Progress Notes (Deleted)
? ?  New Patient Office Visit ? ?Subjective:  ?Patient ID: Marvin Baker, male    DOB: October 31, 1985  Age: 36 y.o. MRN: [id number] ? ?CC: No chief complaint on file. ? ? ?HPI ?Marvin Baker presents for new patient visit to establish care.  Introduced to Publishing rights manager role and practice setting.  All questions answered.  Discussed provider/patient relationship and expectations. ? ?He has a chronic cough with sputum production for 2 months. He went to the ER on 06/28/21 and was diagnosed with bronchitis vs allergies vs early pneumonia. He was started on tessalon, azithromycin, and zyrtec. Chest x-ray was negative for acute disease.  ? ?No past medical history on file. ? ?No past surgical history on file. ? ?No family history on file. ? ?Social History  ? ?Socioeconomic History  ? Marital status: Married  ?  Spouse name: Not on file  ? Number of children: Not on file  ? Years of education: Not on file  ? Highest education level: Not on file  ?Occupational History  ? Not on file  ?Tobacco Use  ? Smoking status: Never  ? Smokeless tobacco: Never  ?Vaping Use  ? Vaping Use: Never used  ?Substance and Sexual Activity  ? Alcohol use: No  ? Drug use: No  ? Sexual activity: Not on file  ?Other Topics Concern  ? Not on file  ?Social History Narrative  ? Not on file  ? ?Social Determinants of Health  ? ?Financial Resource Strain: Not on file  ?Food Insecurity: Not on file  ?Transportation Needs: Not on file  ?Physical Activity: Not on file  ?Stress: Not on file  ?Social Connections: Not on file  ?Intimate Partner Violence: Not on file  ? ? ?ROS ?Review of Systems ? ?Objective:  ? ?Today's Vitals: There were no vitals taken for this visit. ? ?Physical Exam ? ?Assessment & Plan:  ? ?Problem List Items Addressed This Visit   ?None ? ? ?Outpatient Encounter Medications as of 07/03/2021  ?Medication Sig  ? albuterol (VENTOLIN HFA) 108 (90 Base) MCG/ACT inhaler Inhale 1-2 puffs into the lungs every 6 (six) hours as needed for wheezing or  shortness of breath.  ? azithromycin (ZITHROMAX) 250 MG tablet Take 2 tablets by mouth on day 1, then take 1 tablet daily until finished.  ? benzonatate (TESSALON) 100 MG capsule Take 1 capsule (100 mg total) by mouth every 8 (eight) hours.  ? cetirizine (ZYRTEC) 10 MG tablet Take 1 tablet (10 mg total) by mouth daily as needed for allergies.  ? ibuprofen (ADVIL,MOTRIN) 800 MG tablet Take 1 tablet (800 mg total) by mouth 3 (three) times daily.  ? naproxen (NAPROSYN) 500 MG tablet Take 1 tablet (500 mg total) by mouth 2 (two) times daily.  ? Phenylephrine HCl (AFRIN ALLERGY NA) Place 1 spray into the nose daily as needed (congestion).  ? ?No facility-administered encounter medications on file as of 07/03/2021.  ? ? ?Follow-up: No follow-ups on file.  ? ?Gerre Scull, NP ? ?

## 2021-07-03 ENCOUNTER — Ambulatory Visit: Payer: Managed Care, Other (non HMO) | Admitting: Nurse Practitioner

## 2021-07-03 ENCOUNTER — Telehealth: Payer: Self-pay | Admitting: Nurse Practitioner

## 2021-07-03 NOTE — Telephone Encounter (Signed)
Pt did not show up for his NP app with Lauren on 07/03/21. I sent a No Show letter.  ?

## 2021-07-17 NOTE — Telephone Encounter (Signed)
1st no show, fee waived, KO ?

## 2024-02-11 ENCOUNTER — Ambulatory Visit: Payer: Self-pay | Admitting: Nurse Practitioner
